# Patient Record
Sex: Female | Born: 2000 | Hispanic: Yes | Marital: Single | Smoking: Never smoker
Health system: Southern US, Community
[De-identification: ages and names within clinical notes are randomized; demographics above are authoritative.]

---
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
## 2021-04-23 ENCOUNTER — Emergency Department (HOSPITAL_COMMUNITY): Payer: Medicaid Other

## 2021-04-23 ENCOUNTER — Inpatient Hospital Stay (HOSPITAL_COMMUNITY)
Admission: EM | Admit: 2021-04-23 | Discharge: 2021-04-24 | DRG: 536 | Disposition: A | Discharge status: Home / Self Care | Payer: Medicaid Other | Attending: Orthopaedic Surgery | Admitting: Orthopaedic Surgery

## 2021-04-23 ENCOUNTER — Encounter (HOSPITAL_COMMUNITY): Payer: Self-pay | Admitting: Emergency Medicine

## 2021-04-23 ENCOUNTER — Other Ambulatory Visit: Payer: Self-pay

## 2021-04-23 DIAGNOSIS — S32592A Other specified fracture of left pubis, initial encounter for closed fracture: Secondary | ICD-10-CM

## 2021-04-23 DIAGNOSIS — Z20822 Contact with and (suspected) exposure to covid-19: Secondary | ICD-10-CM | POA: Diagnosis present

## 2021-04-23 DIAGNOSIS — M545 Low back pain, unspecified: Secondary | ICD-10-CM | POA: Diagnosis present

## 2021-04-23 DIAGNOSIS — S32810A Multiple fractures of pelvis with stable disruption of pelvic ring, initial encounter for closed fracture: Secondary | ICD-10-CM | POA: Diagnosis present

## 2021-04-23 DIAGNOSIS — S32129A Unspecified Zone II fracture of sacrum, initial encounter for closed fracture: Secondary | ICD-10-CM | POA: Diagnosis present

## 2021-04-23 DIAGNOSIS — M25552 Pain in left hip: Secondary | ICD-10-CM | POA: Diagnosis present

## 2021-04-23 DIAGNOSIS — R102 Pelvic and perineal pain: Secondary | ICD-10-CM

## 2021-04-23 LAB — CBC WITH DIFFERENTIAL/PLATELET
Abs Immature Granulocytes: 0.25 10*3/uL — ABNORMAL HIGH (ref 0.00–0.07)
Basophils Absolute: 0 10*3/uL (ref 0.0–0.1)
Basophils Relative: 0 %
Eosinophils Absolute: 0 10*3/uL (ref 0.0–0.5)
Eosinophils Relative: 0 %
HCT: 37.6 % (ref 36.0–46.0)
Hemoglobin: 12.3 g/dL (ref 12.0–15.0)
Immature Granulocytes: 2 %
Lymphocytes Relative: 16 %
Lymphs Abs: 1.9 10*3/uL (ref 0.7–4.0)
MCH: 29.6 pg (ref 26.0–34.0)
MCHC: 32.7 g/dL (ref 30.0–36.0)
MCV: 90.6 fL (ref 80.0–100.0)
Monocytes Absolute: 0.7 10*3/uL (ref 0.1–1.0)
Monocytes Relative: 6 %
Neutro Abs: 9.1 10*3/uL — ABNORMAL HIGH (ref 1.7–7.7)
Neutrophils Relative %: 76 %
Platelets: 208 10*3/uL (ref 150–400)
RBC: 4.15 MIL/uL (ref 3.87–5.11)
RDW: 13.1 % (ref 11.5–15.5)
WBC: 11.9 10*3/uL — ABNORMAL HIGH (ref 4.0–10.5)
nRBC: 0 % (ref 0.0–0.2)

## 2021-04-23 LAB — BASIC METABOLIC PANEL
Anion gap: 9 (ref 5–15)
BUN: 14 mg/dL (ref 6–20)
CO2: 23 mmol/L (ref 22–32)
Calcium: 9.4 mg/dL (ref 8.9–10.3)
Chloride: 103 mmol/L (ref 98–111)
Creatinine, Ser: 0.78 mg/dL (ref 0.44–1.00)
GFR, Estimated: >60 mL/min (ref >60)
Glucose, Bld: 133 mg/dL — ABNORMAL HIGH (ref 70–99)
Potassium: 3.5 mmol/L (ref 3.5–5.1)
Sodium: 135 mmol/L (ref 135–145)

## 2021-04-23 LAB — RESP PANEL BY RT-PCR (FLU A&B, COVID) ARPGX2
Influenza A by PCR: NEGATIVE
Influenza B by PCR: NEGATIVE
SARS Coronavirus 2 by RT PCR: NEGATIVE

## 2021-04-23 LAB — I-STAT BETA HCG BLOOD, ED (MC, WL, AP ONLY): I-stat hCG, quantitative: <5 m[IU]/mL (ref <5)

## 2021-04-23 MED ORDER — ACETAMINOPHEN 500 MG PO TABS
1000.0000 mg | ORAL_TABLET | Freq: Four times a day (QID) | ORAL | Status: AC
  Administered 2021-04-23 (×4): 1000 mg via ORAL
  Filled 2021-04-23 (×4): qty 2

## 2021-04-23 MED ORDER — NAPROXEN 500 MG PO TABS: 500.0000 mg | ORAL_TABLET | Freq: Two times a day (BID) | ORAL | 0 refills | Status: DC

## 2021-04-23 MED ORDER — HYDROMORPHONE HCL 1 MG/ML IJ SOLN: 0.5000 mg | INTRAMUSCULAR | Status: DC | PRN

## 2021-04-23 MED ORDER — POLYETHYLENE GLYCOL 3350 17 G PO PACK: 17.0000 g | PACK | Freq: Every day | ORAL | Status: DC | PRN

## 2021-04-23 MED ORDER — METHOCARBAMOL 500 MG PO TABS: 500.0000 mg | ORAL_TABLET | Freq: Four times a day (QID) | ORAL | Status: DC | PRN

## 2021-04-23 MED ORDER — DOCUSATE SODIUM 100 MG PO CAPS
100.0000 mg | ORAL_CAPSULE | Freq: Two times a day (BID) | ORAL | Status: DC
  Administered 2021-04-23 – 2021-04-24 (×3): 100 mg via ORAL
  Filled 2021-04-23 (×3): qty 1

## 2021-04-23 MED ORDER — OXYCODONE HCL 5 MG PO TABS
10.0000 mg | ORAL_TABLET | ORAL | Status: DC | PRN
  Administered 2021-04-23 – 2021-04-24 (×2): 10 mg via ORAL
  Filled 2021-04-23: qty 2

## 2021-04-23 MED ORDER — METOCLOPRAMIDE HCL 5 MG/ML IJ SOLN: 5.0000 mg | Freq: Three times a day (TID) | INTRAMUSCULAR | Status: DC | PRN

## 2021-04-23 MED ORDER — METHOCARBAMOL 1000 MG/10ML IJ SOLN
500.0000 mg | Freq: Four times a day (QID) | INTRAMUSCULAR | Status: DC | PRN
  Filled 2021-04-23: qty 5

## 2021-04-23 MED ORDER — DOXYCYCLINE HYCLATE 100 MG PO CAPS: 100.0000 mg | ORAL_CAPSULE | Freq: Two times a day (BID) | ORAL | 0 refills | Status: DC

## 2021-04-23 MED ORDER — OXYCODONE HCL 5 MG PO TABS
5.0000 mg | ORAL_TABLET | ORAL | Status: DC | PRN
  Administered 2021-04-23: 10 mg via ORAL
  Administered 2021-04-24: 5 mg via ORAL
  Filled 2021-04-23: qty 2
  Filled 2021-04-23: qty 1
  Filled 2021-04-23: qty 2

## 2021-04-23 MED ORDER — METOCLOPRAMIDE HCL 10 MG PO TABS
5.0000 mg | ORAL_TABLET | Freq: Three times a day (TID) | ORAL | Status: DC | PRN
Start: 2021-04-23 — End: 2021-04-24

## 2021-04-23 MED ORDER — ONDANSETRON HCL 4 MG/2ML IJ SOLN
4.0000 mg | Freq: Once | INTRAMUSCULAR | Status: AC
  Administered 2021-04-23: 4 mg via INTRAVENOUS
  Filled 2021-04-23: qty 2

## 2021-04-23 MED ORDER — ONDANSETRON 4 MG PO TBDP
4.0000 mg | ORAL_TABLET | Freq: Once | ORAL | Status: AC
  Administered 2021-04-23: 4 mg via ORAL
  Filled 2021-04-23: qty 1

## 2021-04-23 MED ORDER — MAGNESIUM CITRATE PO SOLN: 1.0000 | Freq: Once | ORAL | Status: DC | PRN

## 2021-04-23 MED ORDER — SODIUM CHLORIDE 0.9 % IV SOLN: INTRAVENOUS | Status: DC

## 2021-04-23 MED ORDER — ONDANSETRON HCL 4 MG PO TABS: 4.0000 mg | ORAL_TABLET | Freq: Four times a day (QID) | ORAL | Status: DC | PRN

## 2021-04-23 MED ORDER — OXYCODONE-ACETAMINOPHEN 5-325 MG PO TABS
2.0000 | ORAL_TABLET | Freq: Once | ORAL | Status: AC
Start: 2021-04-23 — End: 2021-04-23
  Administered 2021-04-23: 2 via ORAL
  Filled 2021-04-23: qty 2

## 2021-04-23 MED ORDER — OXYCODONE-ACETAMINOPHEN 5-325 MG PO TABS: 1.0000 | ORAL_TABLET | ORAL | 0 refills | Status: AC | PRN

## 2021-04-23 MED ORDER — SORBITOL 70 % SOLN
30.0000 mL | Freq: Every day | Status: DC | PRN
  Filled 2021-04-23: qty 30

## 2021-04-23 MED ORDER — ACETAMINOPHEN 325 MG PO TABS
325.0000 mg | ORAL_TABLET | Freq: Four times a day (QID) | ORAL | Status: DC | PRN
Start: 2021-04-24 — End: 2021-04-24
  Administered 2021-04-24 (×2): 650 mg via ORAL
  Filled 2021-04-23 (×2): qty 2

## 2021-04-23 MED ORDER — DIPHENHYDRAMINE HCL 12.5 MG/5ML PO ELIX: 12.5000 mg | ORAL_SOLUTION | ORAL | Status: DC | PRN

## 2021-04-23 MED ORDER — ONDANSETRON HCL 4 MG/2ML IJ SOLN: 4.0000 mg | Freq: Four times a day (QID) | INTRAMUSCULAR | Status: DC | PRN

## 2021-04-23 MED ORDER — HYDROMORPHONE HCL 1 MG/ML IJ SOLN
1.0000 mg | Freq: Once | INTRAMUSCULAR | Status: AC
  Administered 2021-04-23: 1 mg via INTRAVENOUS
  Filled 2021-04-23: qty 1

## 2021-04-23 NOTE — Plan of Care (Signed)
  Problem: Coping: Goal: Level of anxiety will decrease Outcome: Progressing   Problem: Pain Managment: Goal: General experience of comfort will improve Outcome: Progressing   

## 2021-04-23 NOTE — Progress Notes (Signed)
Reviewed films with Dr. Carola Frost and [place].  They feel it's a stable fracture pattern therefore we will admit the patient to [facility] and mobilize with PT as pain allows.  WBAT BLE.

## 2021-04-23 NOTE — Plan of Care (Signed)
  Problem: Education: Goal: Knowledge of General Education information will improve Description: Including pain rating scale, medication(s)/side effects and non-pharmacologic comfort measures Outcome: Progressing   Problem: Pain Managment: Goal: General experience of comfort will improve Outcome: Progressing   Problem: Safety: Goal: Ability to remain free from injury will improve Outcome: Progressing   
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
## 2021-04-23 NOTE — Plan of Care (Signed)
Plan of care started.
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
## 2021-04-23 NOTE — Discharge Instructions (Addendum)
You need to call and schedule an appointment with the orthopedic.  Please take pain medication as prescribed.  If your pain worsens such that the pain medicine does not cover it, return to the emergency department.  If you have any other new or worsening symptoms, please return to the ER.

## 2021-04-24 ENCOUNTER — Other Ambulatory Visit: Payer: Self-pay | Admitting: Physician Assistant

## 2021-04-24 LAB — HIV ANTIBODY (ROUTINE TESTING W REFLEX): HIV Screen 4th Generation wRfx: NONREACTIVE

## 2021-04-24 NOTE — Progress Notes (Signed)
Provided discharge education/instructions, all questions and concerns addressed, Pt not in distress, DME delivered to room, discharged home with belongings accompanied by family.

## 2021-04-24 NOTE — Discharge Summary (Signed)
Patient ID: Tammy Pratt MRN: [id number] DOB/AGE: Aug 14, 2001 19 y.o.  Admit date: 04/23/2021 Discharge date: 04/24/2021  Admission Diagnoses:  Active Problems:   Pelvic ring fracture [facility])   Discharge Diagnoses:  Same  History reviewed. No pertinent past medical history.  Surgeries:  on * No surgery found *   Consultants:   Discharged Condition: Improved  Hospital Course: Tammy Pratt is an 20 y.o. female who was admitted 04/23/2021 for operative treatment of<principal problem not specified>. Patient has severe unremitting pain that affects sleep, daily activities, and work/hobbies. After pre-op clearance the patient was taken to the operating room on * No surgery found * and underwent  .    Patient was given perioperative antibiotics:  Anti-infectives (From admission, onward)    Start     Dose/Rate Route Frequency Ordered Stop   04/23/21 0000  doxycycline (VIBRAMYCIN) 100 MG capsule  Status:  Discontinued        100 mg Oral 2 times daily 04/23/21 0345 04/23/21         Patient was given sequential compression devices, early ambulation, and chemoprophylaxis to prevent DVT.  Patient benefited maximally from hospital stay and there were no complications.    Recent vital signs: Patient Vitals for the past 24 hrs:  BP Temp Temp src Pulse Resp SpO2  04/24/21 1321 114/81 -- -- 85 18 91 %  04/24/21 0555 105/70 98.4 F (36.9 C) Oral 70 16 100 %  04/23/21 2147 130/75 98.4 F (36.9 C) Oral 79 16 100 %  04/23/21 1816 106/86 98.2 F (36.8 C) Oral 76 16 100 %     Recent laboratory studies:  Recent Labs    04/23/21 0707  WBC 11.9*  HGB 12.3  HCT 37.6  PLT 208  NA 135  K 3.5  CL 103  CO2 23  BUN 14  CREATININE 0.78  GLUCOSE 133*  CALCIUM 9.4     Discharge Medications:   Allergies as of 04/24/2021   No Known Allergies      Medication List     TAKE these medications    oxyCODONE-acetaminophen 5-325 MG tablet Commonly known as:  Percocet Take 1 tablet by mouth every 4 (four) hours as needed.               Durable Medical Equipment  (From admission, onward)           Start     Ordered   04/24/21 1610  For home use only DME 3 n 1  Once        04/24/21 1610   04/24/21 1609  For home use only DME Walker rolling  Once       Comments: Youth RW  Question Answer Comment  Walker: With 5 Inch Wheels   Patient needs a walker to treat with the following condition Pelvic fracture (HCC)      04/24/21 1609            Diagnostic Studies: DG Lumbar Spine Complete  Result Date: 04/23/2021 CLINICAL DATA:  Fall, low back pain EXAM: LUMBAR SPINE - COMPLETE 4+ VIEW COMPARISON:  None. FINDINGS: There is no evidence of lumbar spine fracture. Alignment is normal. Intervertebral disc spaces are maintained. IMPRESSION: Negative. Electronically Signed   By: Charlett Nose M.D.   On: 04/23/2021 03:05   CT PELVIS WO CONTRAST  Result Date: 04/23/2021 CLINICAL DATA:  Pelvic pain after fall EXAM: CT PELVIS WITHOUT CONTRAST TECHNIQUE: Multidetector CT imaging of the pelvis was performed following the standard protocol  without intravenous contrast. COMPARISON:  Radiograph 04/23/2021 FINDINGS: Urinary Tract: Distal ureters are unremarkable. No convincing evidence of direct bladder injury or rupture. No other acute bladder abnormality. Bowel: No visible large or small bowel thickening or dilatation or CT evidence of obstruction within the included segments. Normal appearing appendix courses from the cecal tip towards midline. Vascular/Lymphatic: Limited assessment of the vasculature in the absence of contrast media. Small amount of extraperitoneal hemorrhage seen adjacent the left pelvic and sacral fractures. No suspicious or enlarged lymph nodes in the included lymphatic chains. Reproductive:  Anteverted uterus.  No concerning adnexal lesions. Other: Small amount of extraperitoneal hemorrhage adjacent the fractures of the left pubic  body, inferior pubic ramus and pubic root as well as the left sacral fracture. No free intraperitoneal air or fluid. Contusive changes along the anterior and lateral left hip soft tissues. Musculoskeletal: Multiple pelvic fractures in a constellation of findings concerning for a left lateral compression type 1 fracture pattern including: *Zone II left sacral ala fracture with extension into the SI joint albeit without significant SI joint diastasis/widening. *Fracture of the left superior ramus extending into the pubic root with fracture lines extending into the anterior wall left acetabulum. *Fracture involving the left pubic body with extension towards the symphysis pubis without diastatic widening. *Minimally displaced fracture of the left inferior pubic ramus. *Associated extraperitoneal hemorrhage is, as above as well as asymmetric thickening of the left operator internus which could reflect a degree of intramuscular hematoma No acute fracture or traumatic osseous injury of the included lumbar levels. Proximal femora are intact and normally located. IMPRESSION: 1. Constellation of findings compatible with a left lateral compression type 1. Injury includes: Zone 2 sacral ala fracture, fracture of the left pubic root extending into the anterior wall left acetabulum, left pubic body fracture extending into the pubic symphysis, left inferior pubic ramus fracture. 2. No abnormal diastatic widening of the SI joints or symphysis pubis. 3. Small volume of extraperitoneal hemorrhage as well as asymmetric thickening of the operator internus, likely degree of intramuscular hemorrhage. 4. Additional contusive changes along the anterior and lateral left hip. Electronically Signed   By: Kreg Shropshire M.D.   On: 04/23/2021 05:36   DG Pelvis Comp Min 3V  Result Date: 04/23/2021 CLINICAL DATA:  Pelvic pain after fall EXAM: JUDET PELVIS - 3+ VIEW COMPARISON:  None. FINDINGS: Acute fracture at the puboacetabular junction on  the left. On the outlet view there is up to 9 mm of displacement. Nondisplaced inferior pubic ramus fracture on the left. Buckling of the posterior cortex of the left pubic body. No symphysis pubis or sacroiliac widening. IMPRESSION: Left pubic fractures with mild displacement at the puboacetabular junction. Electronically Signed   By: Marnee Spring M.D.   On: 04/23/2021 04:49   DG HIP UNILAT W OR W/O PELVIS MIN 4 VIEWS LEFT  Result Date: 04/23/2021 CLINICAL DATA:  Fall, low back pain EXAM: DG HIP (WITH OR WITHOUT PELVIS) 4+V LEFT COMPARISON:  None. FINDINGS: There are left superior and inferior pubic rami fractures. Fracture also likely present in the left pubic bone. No proximal femoral abnormality. No subluxation or dislocation. IMPRESSION: Left superior and inferior pubic rami fractures. Probable left pubic bone fracture. Electronically Signed   By: Charlett Nose M.D.   On: 04/23/2021 03:02   DG Femur Min 2 Views Left  Result Date: 04/23/2021 CLINICAL DATA:  Fall EXAM: LEFT FEMUR 2 VIEWS COMPARISON:  Pelvis today FINDINGS: Fractures are noted through the left superior and  inferior pubic rami as seen on pelvis series. No femoral abnormality. No subluxation or dislocation. IMPRESSION: Left superior and inferior pubic rami fractures. Electronically Signed   By: Charlett Nose M.D.   On: 04/23/2021 03:04    Disposition: Discharge disposition: 01-Home or Self Care          Follow-up Information     Tarry Kos, MD. Schedule an appointment as soon as possible for a visit in 2 week(s).   Specialty: Orthopedic Surgery Contact information: [address] [place] [address] [phone]                  Signed: Cristie Hem 04/24/2021, 4:18 PM

## 2021-04-24 NOTE — TOC Transition Note (Signed)
Transition of Care [facility]) - CM/SW Discharge Note   Patient Details  Name: Manreet Kiernan MRN: [id number] Date of Birth: [birth year]  [facility]) CM/SW Contact:  Amada Jupiter, LCSW Phone Number: 04/24/2021, 4:58 PM   Clinical Narrative:    Pt cleared for dc today.  DME arranged via [employer].  No further needs.   Final next level of care: Home/Self Care Barriers to Discharge: No Barriers Identified   Patient Goals and CMS Choice Patient states their goals for this hospitalization and ongoing recovery are:: to return home      Discharge Placement                       Discharge Plan and Services                DME Arranged: 3-N-1, Walker youth DME Agency: [employer] Date DME Agency Contacted: 04/24/21 Time DME Agency Contacted: 1620 Representative spoke with at DME Agency: [place]            Social Determinants of Health (SDOH) Interventions     Readmission Risk Interventions No flowsheet data found.

## 2021-04-24 NOTE — Progress Notes (Signed)
Subjective:    Patient reports pain as mild.  Increased pain with movement of knee/hip.  Overall, feeling much better since yesterday  Objective: Vital signs in last 24 hours: Temp:  [98.2 F (36.8 C)-98.4 F (36.9 C)] 98.4 F (36.9 C) (06/13 0555) Pulse Rate:  [70-85] 85 (06/13 1321) Resp:  [16-18] 18 (06/13 1321) BP: (105-130)/(70-86) 114/81 (06/13 1321) SpO2:  [91 %-100 %] 91 % (06/13 1321)  Intake/Output from previous day: 06/12 0701 - 06/13 0700 In: 2496.1 [P.O.:960; I.V.:1536.1] Out: 450 [Urine:450] Intake/Output this shift: Total I/O In: 592.9 [I.V.:592.9] Out: -   Recent Labs    04/23/21 0707  HGB 12.3   Recent Labs    04/23/21 0707  WBC 11.9*  RBC 4.15  HCT 37.6  PLT 208   Recent Labs    04/23/21 0707  NA 135  K 3.5  CL 103  CO2 23  BUN 14  CREATININE 0.78  GLUCOSE 133*  CALCIUM 9.4   No results for input(s): LABPT, INR in the last 72 hours. PHYSICAL EXAM Pain with any motion of the left knee/hip Calf soft/nt Neurovascularly intact distally      Assessment/Plan:    PLAN WBAT BLE Mobilized well with PT and pain under control D/c home this afternoon F/u with Dr. Roda Shutters in two weeks      Cristie Hem 04/24/2021, 4:13 PM

## 2021-04-24 NOTE — Plan of Care (Signed)
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
## 2021-05-02 ENCOUNTER — Other Ambulatory Visit: Payer: Self-pay

## 2021-05-02 ENCOUNTER — Ambulatory Visit (INDEPENDENT_AMBULATORY_CARE_PROVIDER_SITE_OTHER): Payer: Medicaid Other | Admitting: Orthopaedic Surgery

## 2021-05-02 ENCOUNTER — Ambulatory Visit (INDEPENDENT_AMBULATORY_CARE_PROVIDER_SITE_OTHER): Payer: Medicaid Other

## 2021-05-02 ENCOUNTER — Encounter: Payer: Self-pay | Admitting: Orthopaedic Surgery

## 2021-05-02 DIAGNOSIS — R102 Pelvic and perineal pain: Secondary | ICD-10-CM
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
## 2021-05-30 ENCOUNTER — Ambulatory Visit: Payer: Medicaid Other | Admitting: Orthopaedic Surgery

## 2021-06-14 ENCOUNTER — Ambulatory Visit: Payer: Medicaid Other | Admitting: Orthopaedic Surgery

## 2021-06-16 ENCOUNTER — Other Ambulatory Visit: Payer: Self-pay

## 2021-06-16 ENCOUNTER — Ambulatory Visit (INDEPENDENT_AMBULATORY_CARE_PROVIDER_SITE_OTHER): Payer: Medicaid Other | Admitting: Orthopaedic Surgery

## 2021-06-16 ENCOUNTER — Ambulatory Visit (INDEPENDENT_AMBULATORY_CARE_PROVIDER_SITE_OTHER): Payer: Medicaid Other

## 2021-06-16 ENCOUNTER — Encounter: Payer: Self-pay | Admitting: Orthopaedic Surgery

## 2021-06-16 DIAGNOSIS — R102 Pelvic and perineal pain: Secondary | ICD-10-CM

## 2021-06-16 DIAGNOSIS — S32592A Other specified fracture of left pubis, initial encounter for closed fracture: Secondary | ICD-10-CM | POA: Diagnosis not present

## 2021-06-16 NOTE — Progress Notes (Signed)
   Office Visit Note   Patient: Tammy Pratt           Date of Birth: 2001/06/21           MRN: [id number] Visit Date: 06/16/2021              Requested by: No referring provider defined for this encounter. PCP: Pcp, No   Assessment & Plan: Visit Diagnoses:  1. Pelvic pain   2. Closed fracture of multiple pubic rami, left, initial encounter [facility])     Plan: Sharice has demonstrated clinical healing of her fractures.  Radiographically these are also healed as well.  She has no activity restrictions other than what causes her discomfort which will eventually completely resolve.  She is released to activity as tolerated.  Follow-up as needed.  Follow-Up Instructions: No follow-ups on file.   Orders:  Orders Placed This Encounter  Procedures   XR Pelvis 1-2 Views   No orders of the defined types were placed in this encounter.     Procedures: No procedures performed   Clinical Data: No additional findings.   Subjective: Chief Complaint  Patient presents with   Pelvis - Follow-up, Fracture    HPI [name] is following up for her pelvic ring injury.  Overall she is doing well and has not much pain.  She has returned back to work.  No real complaints. Review of Systems   Objective: Vital Signs: There were no vitals taken for this visit.  Physical Exam  Ortho Exam Evaluation of the pelvis shows no pain with lateral compression.  She is able to do a Flamingo stand without any pain.  Normal ambulation and gait. Specialty Comments:  No specialty comments available.  Imaging: No results found.   PMFS History: Patient Active Problem List   Diagnosis Date Noted   Pelvic ring fracture (HCC) 04/23/2021   History reviewed. No pertinent past medical history.  History reviewed. No pertinent family history.  History reviewed. No pertinent surgical history. Social History   Occupational History   Not on file  Tobacco Use   Smoking status: Never   Smokeless  tobacco: Never  Vaping Use   Vaping Use: Never used  Substance and Sexual Activity   Alcohol use: Not Currently   Drug use: Not Currently   Sexual activity: Not on file

## 2021-08-31 ENCOUNTER — Ambulatory Visit: Payer: Self-pay | Admitting: Nurse Practitioner

## 2022-10-23 IMAGING — CT CT PELVIS W/O CM
2 of 3 series · 14 of 46 positions shown, 16 images · non-contrast
Comparison: Radiograph 04/23/2021

CLINICAL DATA: Pelvic pain after fall

EXAM:
CT PELVIS WITHOUT CONTRAST
TECHNIQUE: Multidetector CT imaging of the pelvis was performed following the
standard protocol without intravenous contrast.

[Series 3: axial st · axial · 0.92mm/px · z∈[+764,+972]mm · 11 of 120 slices shown, 13 images]
[im 8/120  soft-tissue]
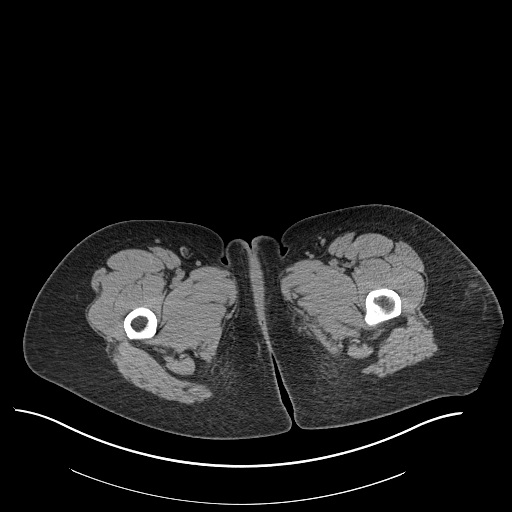
[im 8/120  bone]
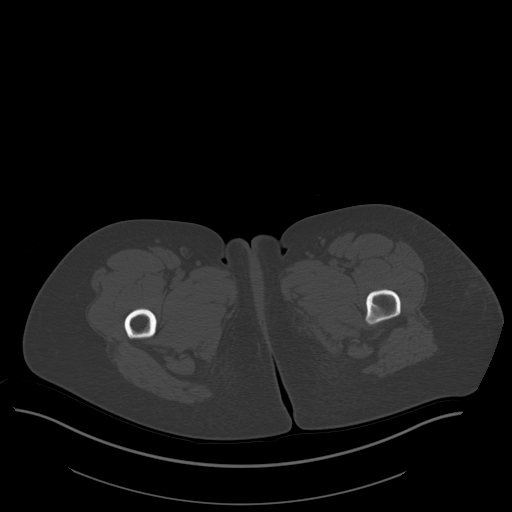
[im 20/120  soft-tissue]
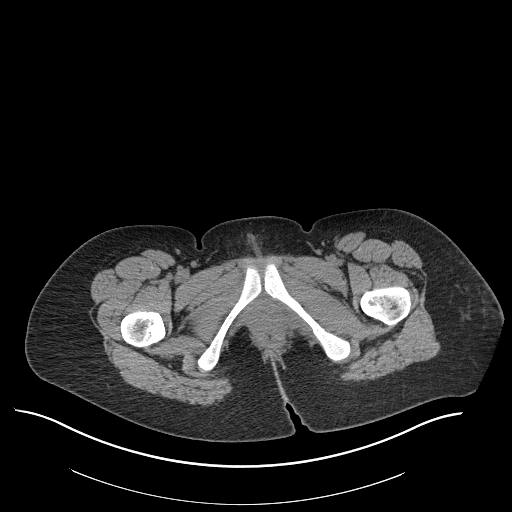
[im 27/120  soft-tissue]
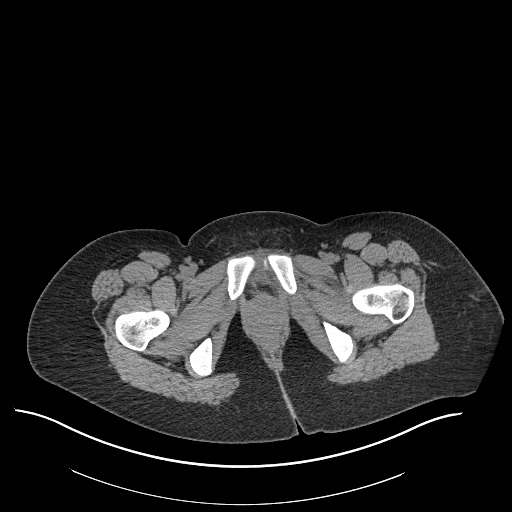
[im 39/120  soft-tissue]
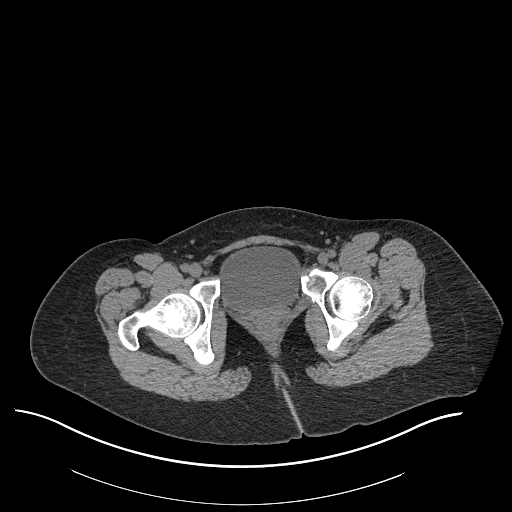
[im 50/120  soft-tissue]
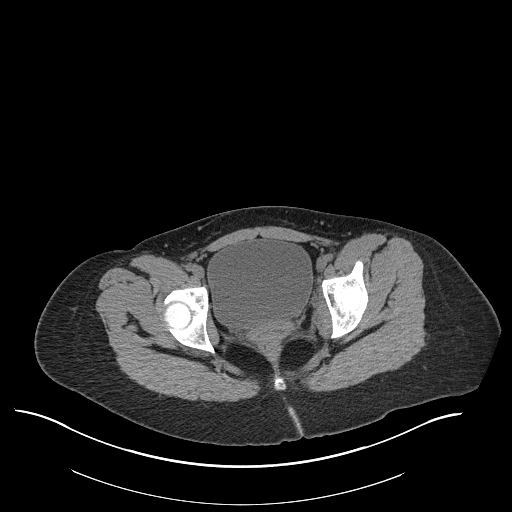
[im 62/120  soft-tissue]
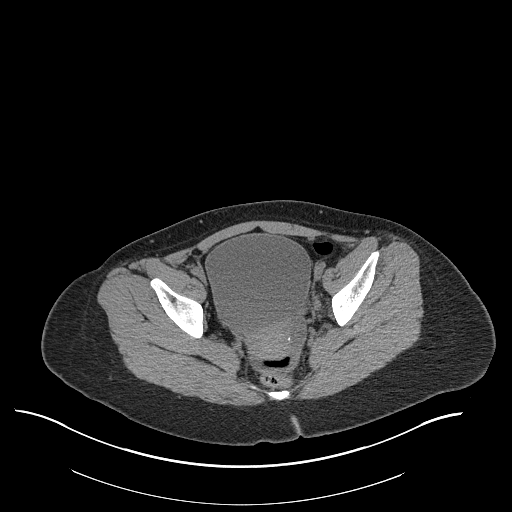
[im 70/120  soft-tissue]
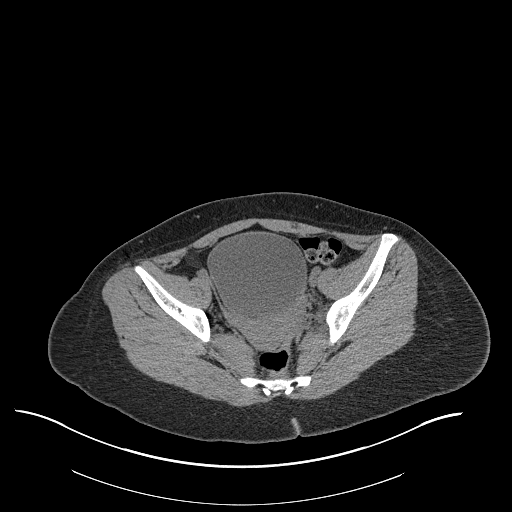
[im 81/120  soft-tissue]
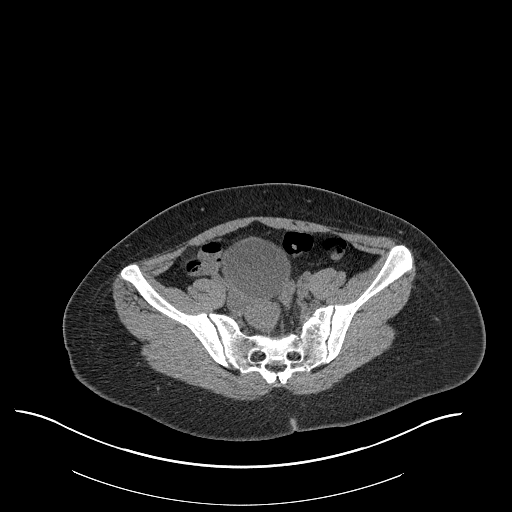
[im 93/120  soft-tissue]
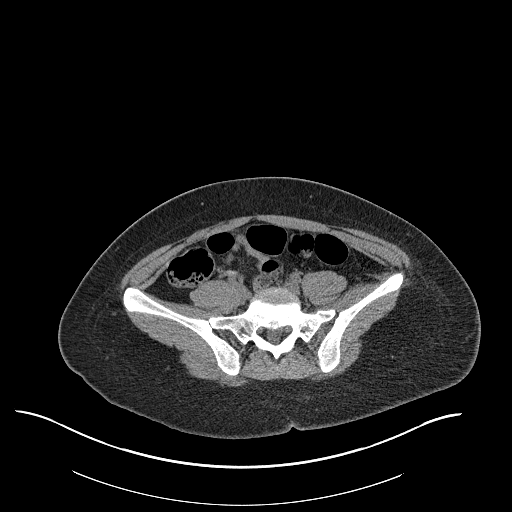
[im 93/120  bone]
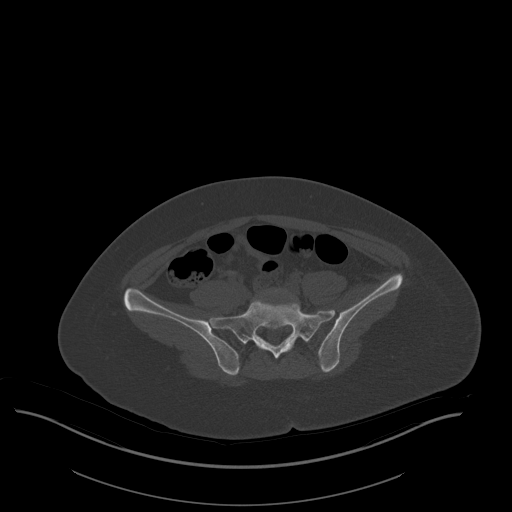
[im 100/120  soft-tissue]
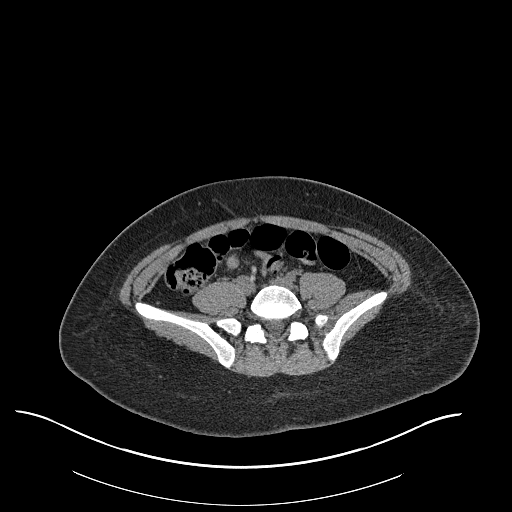
[im 112/120  soft-tissue]
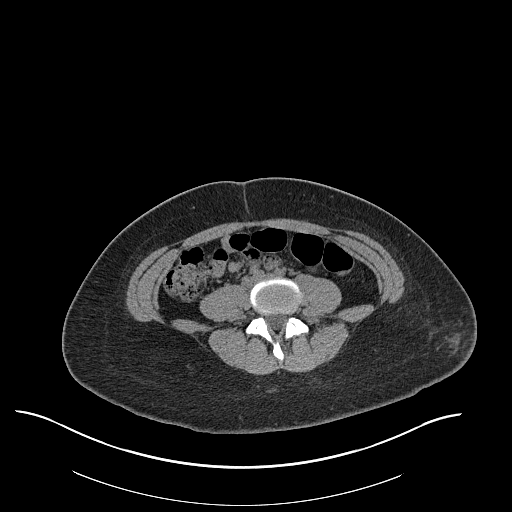

[Series 8: coronal st · coronal · 0.48mm/px · 3 of 128 slices shown]
[im 43/128  soft-tissue]
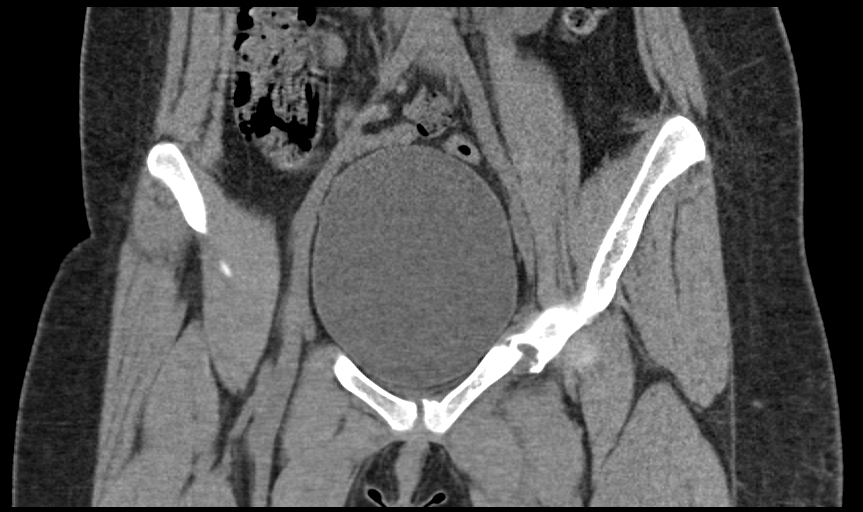
[im 57/128  soft-tissue]
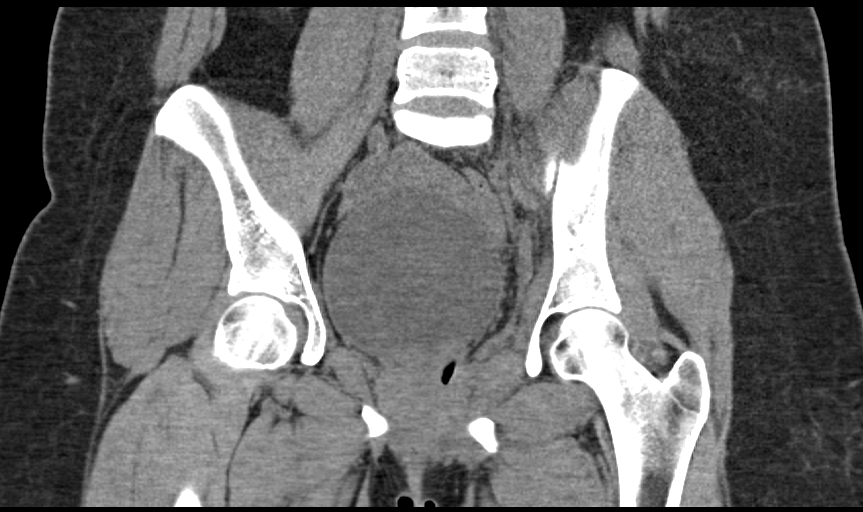
[im 71/128  soft-tissue]
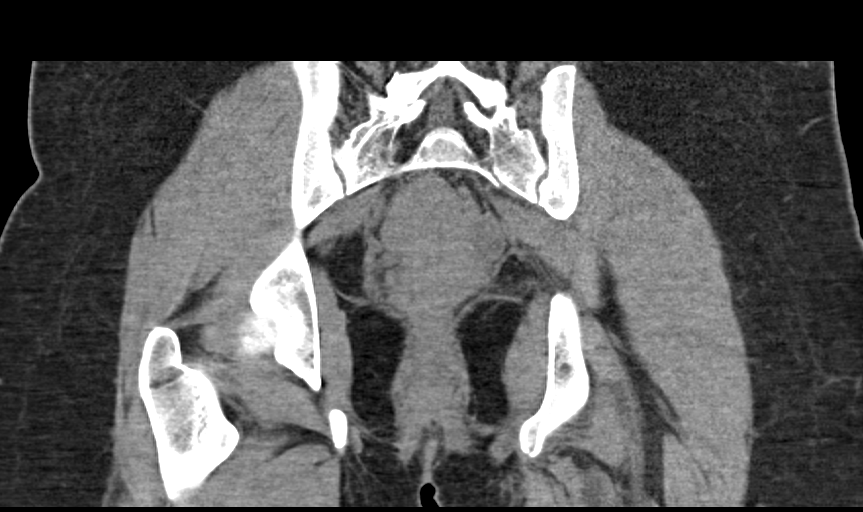

[14 of 46 positions shown; findings below may reference images not displayed]

FINDINGS: Urinary Tract: Distal ureters are unremarkable. No convincing
evidence of direct bladder injury or rupture. No other acute bladder
abnormality.

Bowel: No visible large or small bowel thickening or dilatation or
CT evidence of obstruction within the included segments. Normal
appearing appendix courses from the cecal tip towards midline.

Vascular/Lymphatic: Limited assessment of the vasculature in the
absence of contrast media. Small amount of extraperitoneal
hemorrhage seen adjacent the left pelvic and sacral fractures. No
suspicious or enlarged lymph nodes in the included lymphatic chains.

Reproductive:  Anteverted uterus.  No concerning adnexal lesions.

Other: Small amount of extraperitoneal hemorrhage adjacent the
fractures of the left pubic body, inferior pubic ramus and pubic
root as well as the left sacral fracture. No free intraperitoneal
air or fluid. Contusive changes along the anterior and lateral left
hip soft tissues.

Musculoskeletal:

Multiple pelvic fractures in a constellation of findings concerning
for a left lateral compression type 1 fracture pattern including:

*Zone II left sacral ala fracture with extension into the SI joint
albeit without significant SI joint diastasis/widening.
*Fracture of the left superior ramus extending into the pubic root
with fracture lines extending into the anterior wall left
acetabulum.
*Fracture involving the left pubic body with extension towards the
symphysis pubis without diastatic widening.
*Minimally displaced fracture of the left inferior pubic ramus.
*Associated extraperitoneal hemorrhage is, as above as well as
asymmetric thickening of the left operator internus which could
reflect a degree of intramuscular hematoma

No acute fracture or traumatic osseous injury of the included lumbar
levels. Proximal femora are intact and normally located.
IMPRESSION: 1. Constellation of findings compatible with a left lateral
compression type 1. Injury includes: Zone 2 sacral ala fracture,
fracture of the left pubic root extending into the anterior wall
left acetabulum, left pubic body fracture extending into the pubic
symphysis, left inferior pubic ramus fracture.
2. No abnormal diastatic widening of the SI joints or symphysis
pubis.
3. Small volume of extraperitoneal hemorrhage as well as asymmetric
thickening of the operator internus, likely degree of intramuscular
hemorrhage.
4. Additional contusive changes along the anterior and lateral left
hip.

## 2022-10-23 IMAGING — CR DG HIP (WITH OR WITHOUT PELVIS) 4+V*L*
3 series · 3 of 3 positions shown · non-contrast
Comparison: None.

CLINICAL DATA: Fall, low back pain

EXAM:
DG HIP (WITH OR WITHOUT PELVIS) 4+V LEFT

[t pelvis ap]
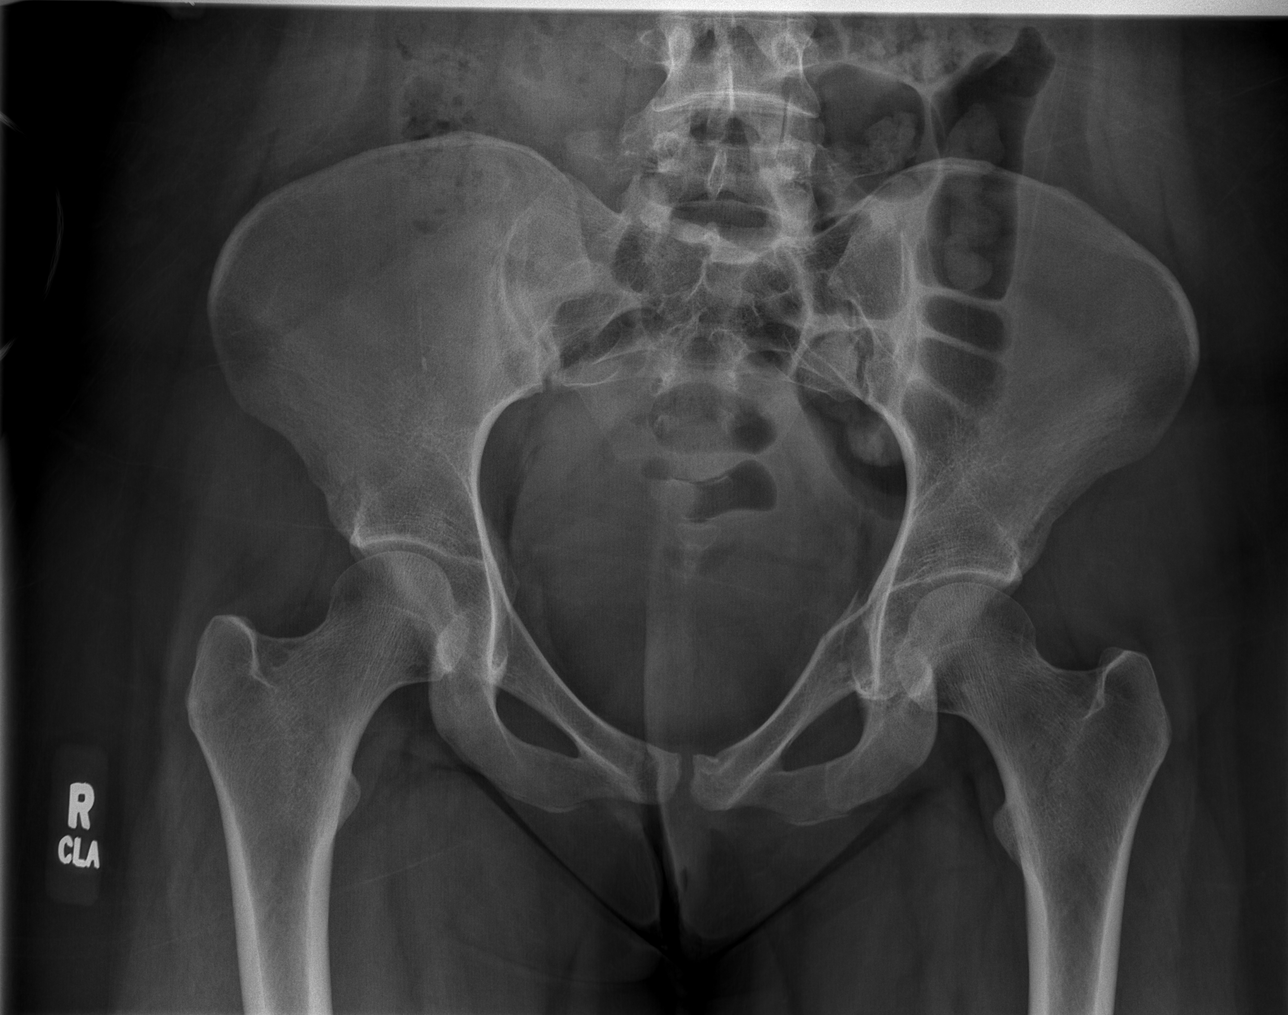

[t hip ap left]
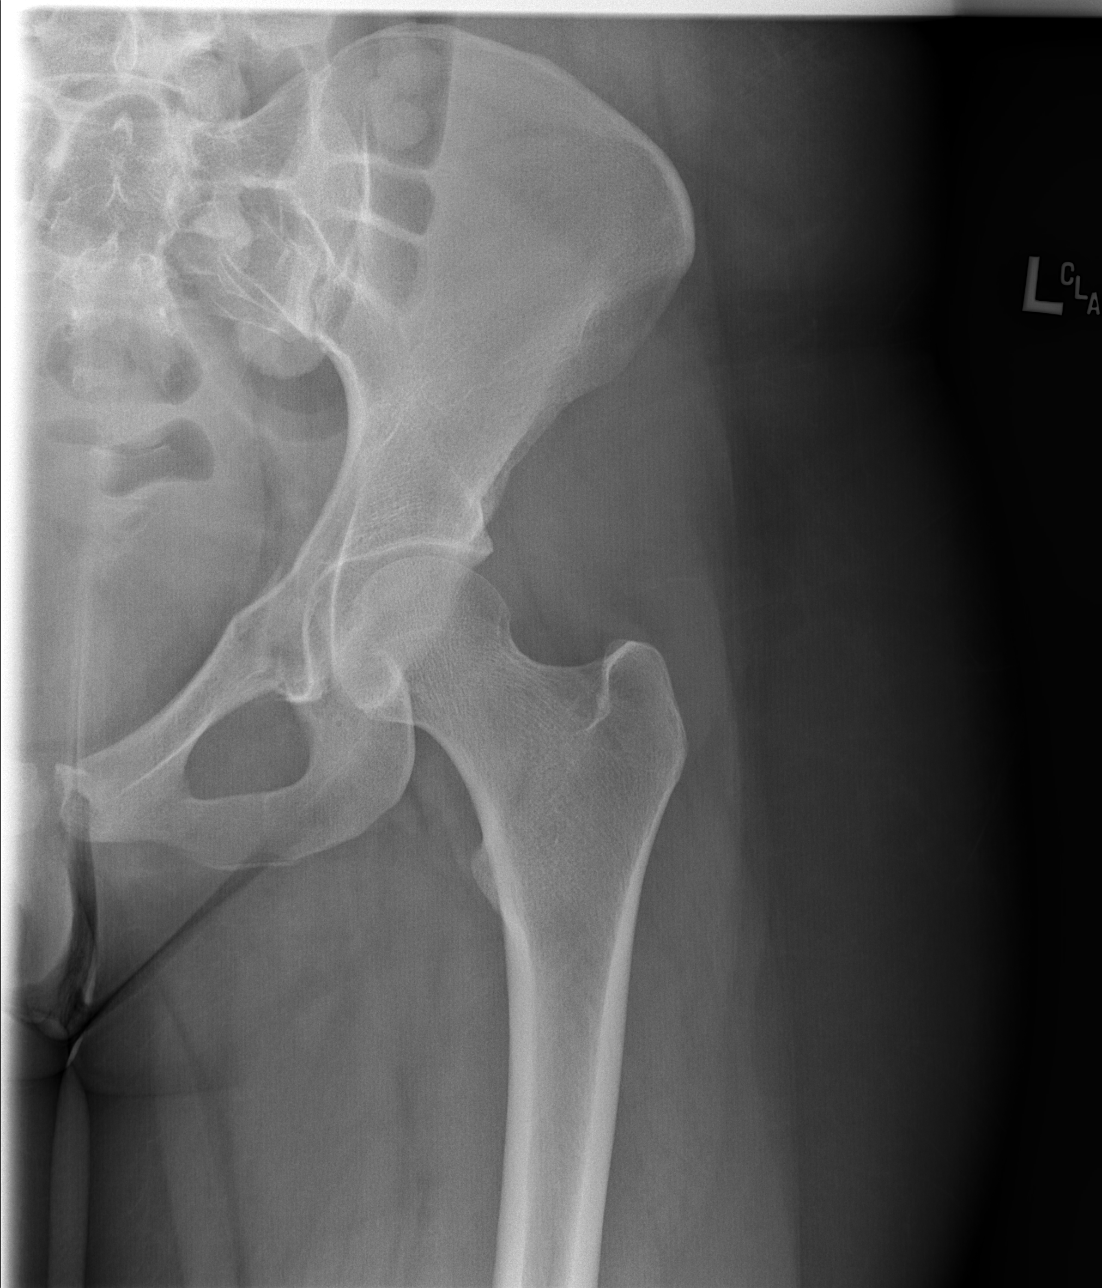

[x hip lat left]
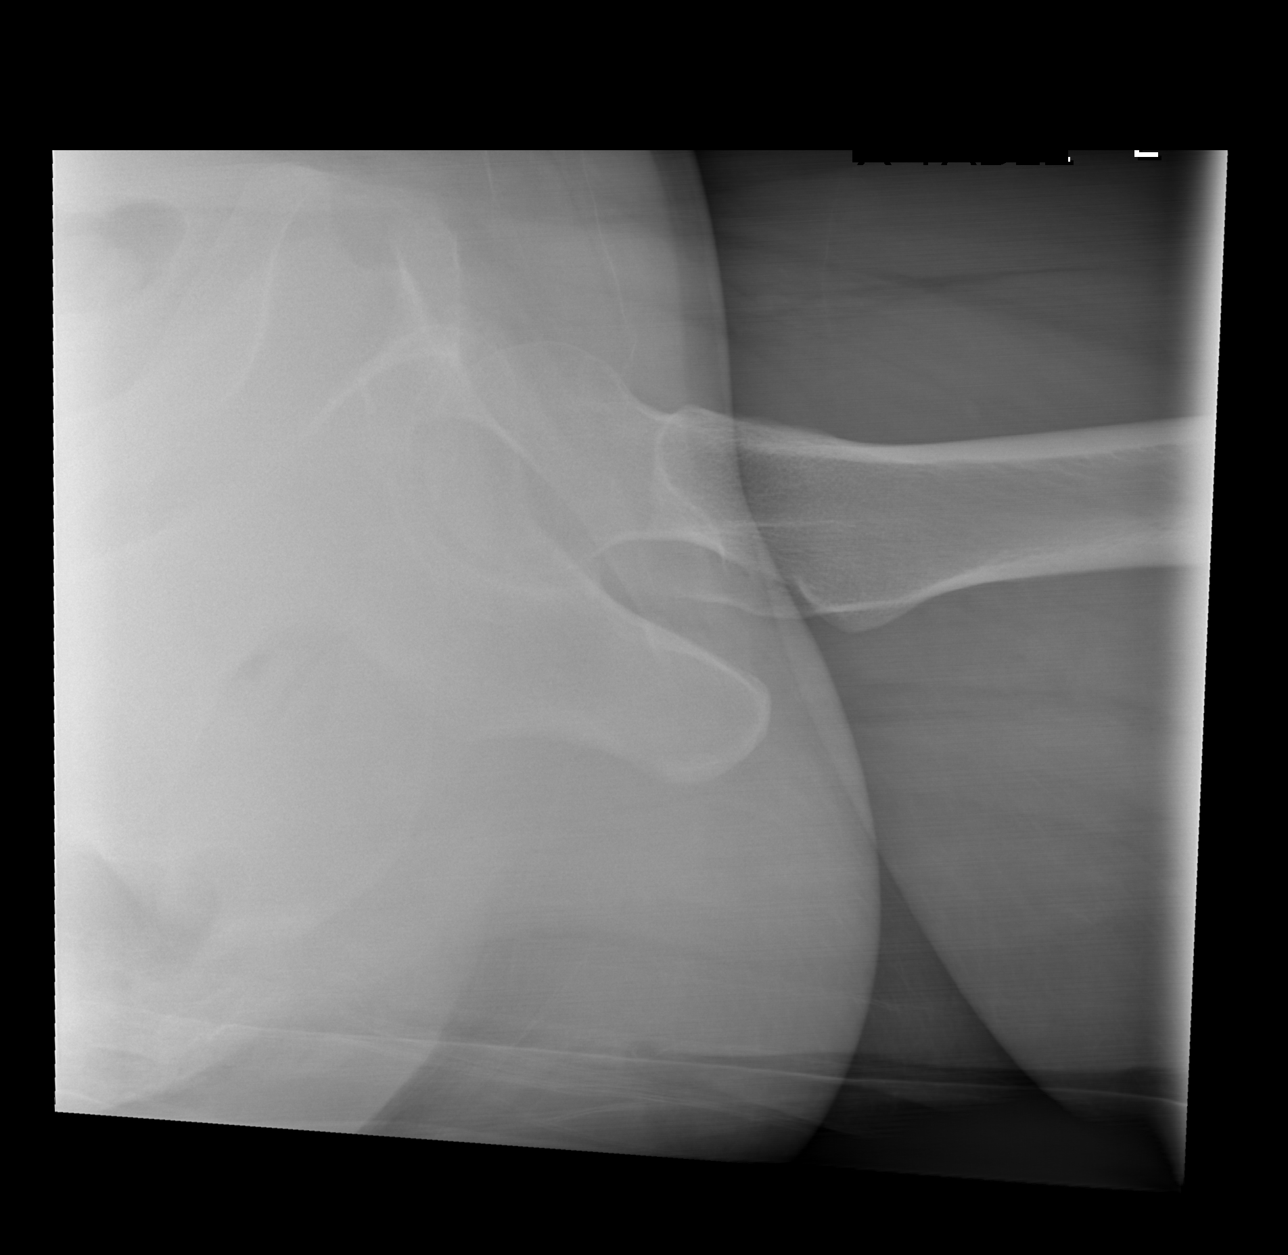

[3 of 3 positions shown; findings below may reference images not displayed]

FINDINGS: There are left superior and inferior pubic rami fractures. Fracture
also likely present in the left pubic bone. No proximal femoral
abnormality. No subluxation or dislocation.
IMPRESSION: Left superior and inferior pubic rami fractures. Probable left pubic
bone fracture.

## 2023-04-30 ENCOUNTER — Encounter (HOSPITAL_BASED_OUTPATIENT_CLINIC_OR_DEPARTMENT_OTHER): Payer: Self-pay | Admitting: Emergency Medicine

## 2023-04-30 ENCOUNTER — Emergency Department (HOSPITAL_BASED_OUTPATIENT_CLINIC_OR_DEPARTMENT_OTHER)
Admission: EM | Admit: 2023-04-30 | Discharge: 2023-04-30 | Disposition: A | Discharge status: Home / Self Care | Payer: Medicaid Other | Attending: Emergency Medicine | Admitting: Emergency Medicine

## 2023-04-30 ENCOUNTER — Other Ambulatory Visit (HOSPITAL_BASED_OUTPATIENT_CLINIC_OR_DEPARTMENT_OTHER): Payer: Self-pay

## 2023-04-30 ENCOUNTER — Other Ambulatory Visit: Payer: Self-pay

## 2023-04-30 DIAGNOSIS — U071 COVID-19: Secondary | ICD-10-CM

## 2023-04-30 DIAGNOSIS — J029 Acute pharyngitis, unspecified: Secondary | ICD-10-CM

## 2023-04-30 LAB — SARS CORONAVIRUS 2 BY RT PCR: SARS Coronavirus 2 by RT PCR: POSITIVE — AB

## 2023-04-30 LAB — GROUP A STREP BY PCR: Group A Strep by PCR: NOT DETECTED

## 2023-04-30 NOTE — ED Triage Notes (Addendum)
Pt arrives pov, steady gait with c/o sore throat with HA and generalized body aches x 2 days. Denies fever. Pt 38wk, G1P0 OB [facility], [place]. EDD 6/30 Pt denies abd cramping or vaginal leaking. Endorses feeling baby move.

## 2023-04-30 NOTE — Progress Notes (Signed)
Dr Adrian Blackwater notified of reactive and reassuring NST with only one UC during the monitoring.  MD says pt may be OB cleared at this time.

## 2023-04-30 NOTE — ED Notes (Signed)
Dc instructions reviewed with patient. Patient voiced understanding. Dc with belongings.  °

## 2023-04-30 NOTE — ED Notes (Addendum)
Per Erin at [facility], pt is OB cleared.

## 2023-04-30 NOTE — ED Notes (Signed)
Spoke with Erin at [facility], per OB, pt to be placed on TOCO for monitoring

## 2023-04-30 NOTE — ED Provider Notes (Signed)
[place] EMERGENCY DEPARTMENT AT [facility] Provider Note   CSN: [id number] Arrival date & time: 04/30/23  0846     History  Chief Complaint  Patient presents with   Sore Throat    Tammy Pratt is a 22 y.o. female.  22 y.o female G1P0 currently [redacted] weeks pregnant presents to the ED with a chief complaint of sore throat, generalized bodyaches for the past 2 days.  Patient's significant other at the bedside was diagnosed with strep 4 days ago, he received a Bicillin injection, was also put on antibiotics her symptoms did not improve.  Patient is concerned that she might have strep pharyngitis as her significant other also does.  She is felt overall rundown and weak but has not had any fever.  She does not have any plans for induction at this time.  She is without any nausea, no vomiting, no abdominal cramping, no vaginal leaking, is feeling the baby girl move well.  The history is provided by the patient.  Sore Throat Pertinent negatives include no chest pain and no shortness of breath.       Home Medications Prior to Admission medications   Medication Sig Start Date End Date Taking? Authorizing Provider  oxyCODONE-acetaminophen (PERCOCET) 5-325 MG tablet Take 1 tablet by mouth every 4 (four) hours as needed. 04/23/21   Roxy Horseman, PA-C      Allergies    Patient has no known allergies.    Review of Systems   Review of Systems  Constitutional:  Negative for chills and fever.  HENT:  Positive for sore throat.   Respiratory:  Negative for shortness of breath.   Cardiovascular:  Negative for chest pain.  Genitourinary:  Negative for vaginal bleeding, vaginal discharge and vaginal pain.  Skin:  Negative for pallor and wound.  All other systems reviewed and are negative.   Physical Exam Updated Vital Signs BP 118/77 (BP Location: Right Arm)   Pulse 91   Temp 97.7 F (36.5 C) (Oral)   Resp 18   Ht 5\' 2"  (1.575 m)   Wt 83 kg   SpO2 99%   BMI  33.47 kg/m  Physical Exam Vitals and nursing note reviewed.  Constitutional:      Appearance: She is well-developed. She is not ill-appearing.  HENT:     Head: Normocephalic and atraumatic.     Mouth/Throat:     Mouth: Mucous membranes are moist.     Pharynx: Uvula midline. Posterior oropharyngeal erythema present.     Tonsils: No tonsillar exudate or tonsillar abscesses.     Comments: Oropharynx is with some mild erythema, no tonsillar exudates noted, no PTA noted.  Uvula is midline without any swelling. Cardiovascular:     Rate and Rhythm: Normal rate.  Pulmonary:     Effort: Pulmonary effort is normal.     Breath sounds: No wheezing.  Abdominal:     Palpations: Abdomen is soft.     Tenderness: There is no abdominal tenderness.  Skin:    General: Skin is warm and dry.  Neurological:     Mental Status: She is alert and oriented to person, place, and time.     ED Results / Procedures / Treatments   Labs (all labs ordered are listed, but only abnormal results are displayed) Labs Reviewed  SARS CORONAVIRUS 2 BY RT PCR - Abnormal; Notable for the following components:      Result Value   SARS Coronavirus 2 by RT PCR POSITIVE (*)  All other components within normal limits  GROUP A STREP BY PCR    EKG None  Radiology No results found.  Procedures Procedures    Medications Ordered in ED Medications - No data to display  ED Course/ Medical Decision Making/ A&P                             Medical Decision Making    Patient here [redacted] weeks pregnant with bodyaches, sore throat that began 2 days ago.  Significant other diagnosed with strep pharyngitis 4 days ago, she is concerned that she may have the same infection as him.  She is not having any lower abdominal pain, no vaginal bleeding, no cramping, no gush of fluid.  She does not have an induction schedule, she is currently followed at [facility] for her obstetric services.  Our OB/GYN on-call Dr. Mickeal Skinner was called  who recommended toco.  Patient's exam is unremarkable, aside from some slight erythema to her oropharynx, no tonsillar exudates, no PTA noted.  Her uvula is midline, she is tolerating her secretions adequately.She is positive Covid 19 on todays visit. Her NST was reactive, cleared by OBGYN. Considered antivirals however patient non toxic apparent, not enough evidence of risk and benefits. Patient stable for discharge.     Portions of this note were generated with Scientist, clinical (histocompatibility and immunogenetics). Dictation errors may occur despite best attempts at proofreading.   Final Clinical Impression(s) / ED Diagnoses Final diagnoses:  Sore throat  COVID-19 virus infection    Rx / DC Orders ED Discharge Orders     None         Claude Manges, PA-C 04/30/23 1052    Ernie Avena, MD 04/30/23 1237

## 2023-04-30 NOTE — Progress Notes (Signed)
[facility] called and informed of pt who presented to [facility] complaining of a sore throat, headache, and body aches for 2 days.  She denies UCs, LOF, or VB.  She also reports normal fetal movement.  Dr Adrian Blackwater would like an NST on pt.

## 2023-04-30 NOTE — Discharge Instructions (Signed)
You tested positive for Covid 19 infection on todays visit.  You will need to contact your OBGYN today to let him know your positive test and your routine checks.
